# Patient Record
Sex: Female | Born: 1970 | Race: White | Hispanic: No | Marital: Married | Smoking: Never smoker
Health system: Southern US, Community
[De-identification: ages and names within clinical notes are randomized; demographics above are authoritative.]

## PROBLEM LIST (undated history)

## (undated) DIAGNOSIS — D649 Anemia, unspecified: Secondary | ICD-10-CM

## (undated) DIAGNOSIS — M069 Rheumatoid arthritis, unspecified: Secondary | ICD-10-CM

## (undated) DIAGNOSIS — N838 Other noninflammatory disorders of ovary, fallopian tube and broad ligament: Secondary | ICD-10-CM

## (undated) HISTORY — PX: TUBAL LIGATION: SHX77

## (undated) HISTORY — DX: Anemia, unspecified: D64.9

## (undated) HISTORY — DX: Other noninflammatory disorders of ovary, fallopian tube and broad ligament: N83.8

## (undated) HISTORY — DX: Rheumatoid arthritis, unspecified: M06.9

---

## 1997-04-29 DIAGNOSIS — N838 Other noninflammatory disorders of ovary, fallopian tube and broad ligament: Secondary | ICD-10-CM

## 1997-04-29 HISTORY — PX: OTHER SURGICAL HISTORY: SHX169

## 1997-04-29 HISTORY — DX: Other noninflammatory disorders of ovary, fallopian tube and broad ligament: N83.8

## 1998-04-29 HISTORY — PX: APPENDECTOMY: SHX54

## 1998-04-29 LAB — HM MAMMOGRAPHY: HM Mammogram: NORMAL

## 2003-11-02 ENCOUNTER — Emergency Department (HOSPITAL_COMMUNITY): Admission: EM | Admit: 2003-11-02 | Discharge: 2003-11-02 | Payer: Self-pay | Admitting: Family Medicine

## 2004-04-18 ENCOUNTER — Other Ambulatory Visit: Admission: RE | Admit: 2004-04-18 | Discharge: 2004-04-18 | Payer: Self-pay | Admitting: Obstetrics and Gynecology

## 2006-03-29 LAB — CONVERTED CEMR LAB: Pap Smear: NORMAL

## 2007-03-20 ENCOUNTER — Encounter: Payer: Self-pay | Admitting: Family Medicine

## 2007-04-15 ENCOUNTER — Ambulatory Visit: Payer: Self-pay | Admitting: Family Medicine

## 2007-05-04 ENCOUNTER — Encounter: Payer: Self-pay | Admitting: Family Medicine

## 2007-06-08 ENCOUNTER — Encounter: Payer: Self-pay | Admitting: Family Medicine

## 2007-06-12 DIAGNOSIS — M069 Rheumatoid arthritis, unspecified: Secondary | ICD-10-CM | POA: Insufficient documentation

## 2007-07-17 ENCOUNTER — Encounter: Payer: Self-pay | Admitting: Family Medicine

## 2007-07-17 ENCOUNTER — Telehealth: Payer: Self-pay | Admitting: Family Medicine

## 2007-07-20 ENCOUNTER — Ambulatory Visit: Payer: Self-pay | Admitting: Family Medicine

## 2007-09-11 ENCOUNTER — Encounter (INDEPENDENT_AMBULATORY_CARE_PROVIDER_SITE_OTHER): Payer: Self-pay | Admitting: *Deleted

## 2007-12-31 ENCOUNTER — Encounter: Payer: Self-pay | Admitting: Family Medicine

## 2008-01-08 ENCOUNTER — Encounter: Payer: Self-pay | Admitting: Family Medicine

## 2008-03-08 ENCOUNTER — Ambulatory Visit: Payer: Self-pay | Admitting: Family Medicine

## 2008-03-08 DIAGNOSIS — H109 Unspecified conjunctivitis: Secondary | ICD-10-CM | POA: Insufficient documentation

## 2008-04-06 ENCOUNTER — Encounter: Payer: Self-pay | Admitting: Family Medicine

## 2008-04-15 ENCOUNTER — Ambulatory Visit: Payer: Self-pay | Admitting: Family Medicine

## 2008-04-15 DIAGNOSIS — D649 Anemia, unspecified: Secondary | ICD-10-CM | POA: Insufficient documentation

## 2008-04-15 LAB — CONVERTED CEMR LAB
Ferritin: 4 ng/mL — ABNORMAL LOW (ref 10.0–291.0)
Iron: 34 ug/dL — ABNORMAL LOW (ref 42–145)
Saturation Ratios: 7.3 % — ABNORMAL LOW (ref 20.0–50.0)
Transferrin: 334.6 mg/dL (ref >=212.0)
Vitamin B-12: 285 pg/mL (ref 211–911)

## 2008-05-03 ENCOUNTER — Encounter: Payer: Self-pay | Admitting: Family Medicine

## 2008-08-02 ENCOUNTER — Encounter: Payer: Self-pay | Admitting: Family Medicine

## 2009-01-18 ENCOUNTER — Encounter: Payer: Self-pay | Admitting: Family Medicine

## 2009-06-01 ENCOUNTER — Encounter: Payer: Self-pay | Admitting: Family Medicine

## 2009-08-28 ENCOUNTER — Encounter: Payer: Self-pay | Admitting: Family Medicine

## 2009-08-31 ENCOUNTER — Encounter: Payer: Self-pay | Admitting: Family Medicine

## 2009-08-31 LAB — CONVERTED CEMR LAB
HCT: 32.7 %
Hemoglobin: 10.8 g/dL
MCHC: 33.2 g/dL
MCV: 76.1 fL
Platelets: 309 10*3/uL
RBC: 4.29 M/uL
RDW: 15.3 %
WBC: 4.5 10*3/uL

## 2009-09-07 ENCOUNTER — Encounter: Payer: Self-pay | Admitting: Family Medicine

## 2009-09-07 ENCOUNTER — Encounter (INDEPENDENT_AMBULATORY_CARE_PROVIDER_SITE_OTHER): Payer: Self-pay | Admitting: *Deleted

## 2009-09-07 LAB — CONVERTED CEMR LAB
Albumin: 3.9 g/dL
BUN: 12 mg/dL
CO2: 29 meq/L
Calcium: 8.9 mg/dL
Chloride: 101 meq/L
Creatinine, Ser: 0.7 mg/dL
Glucose, Bld: 87 mg/dL
Potassium: 4.1 meq/L
Sodium: 137 meq/L

## 2009-10-03 ENCOUNTER — Telehealth (INDEPENDENT_AMBULATORY_CARE_PROVIDER_SITE_OTHER): Payer: Self-pay | Admitting: *Deleted

## 2009-10-03 ENCOUNTER — Ambulatory Visit: Payer: Self-pay | Admitting: Family Medicine

## 2009-10-06 ENCOUNTER — Encounter (INDEPENDENT_AMBULATORY_CARE_PROVIDER_SITE_OTHER): Payer: Self-pay | Admitting: *Deleted

## 2009-10-06 LAB — CONVERTED CEMR LAB
ALT: 21 units/L (ref 0–35)
AST: 24 units/L (ref 0–37)
Albumin: 4.2 g/dL (ref 3.5–5.2)
Alkaline Phosphatase: 44 units/L (ref 39–117)
BUN: 12 mg/dL (ref 6–23)
Basophils Absolute: 0 10*3/uL (ref 0.0–0.1)
Basophils Relative: 0.4 % (ref 0.0–3.0)
Bilirubin, Direct: 0.1 mg/dL (ref 0.0–0.3)
CO2: 27 meq/L (ref 19–32)
Calcium: 9.4 mg/dL (ref 8.4–10.5)
Chloride: 106 meq/L (ref 96–112)
Cholesterol: 177 mg/dL (ref 0–200)
Creatinine, Ser: 0.6 mg/dL (ref 0.4–1.2)
Eosinophils Absolute: 0 10*3/uL (ref 0.0–0.7)
Eosinophils Relative: 1 % (ref 0.0–5.0)
Ferritin: 4.4 ng/mL — ABNORMAL LOW (ref 10.0–291.0)
GFR calc non Af Amer: 122.92 mL/min (ref >60)
Glucose, Bld: 87 mg/dL (ref 70–99)
HCT: 34 % — ABNORMAL LOW (ref 36.0–46.0)
HDL: 60.9 mg/dL (ref >39.00)
Hemoglobin: 11.5 g/dL — ABNORMAL LOW (ref 12.0–15.0)
Iron: 117 ug/dL (ref 42–145)
LDL Cholesterol: 97 mg/dL (ref 0–99)
Lymphocytes Relative: 20.6 % (ref 12.0–46.0)
Lymphs Abs: 0.9 10*3/uL (ref 0.7–4.0)
MCHC: 33.9 g/dL (ref 30.0–36.0)
MCV: 77.7 fL — ABNORMAL LOW (ref 78.0–100.0)
Monocytes Absolute: 0.4 10*3/uL (ref 0.1–1.0)
Monocytes Relative: 9.8 % (ref 3.0–12.0)
Neutro Abs: 3 10*3/uL (ref 1.4–7.7)
Neutrophils Relative %: 68.2 % (ref 43.0–77.0)
Platelets: 237 10*3/uL (ref 150.0–400.0)
Potassium: 3.8 meq/L (ref 3.5–5.1)
RBC: 4.38 M/uL (ref 3.87–5.11)
RDW: 18 % — ABNORMAL HIGH (ref 11.5–14.6)
Saturation Ratios: 25.7 % (ref 20.0–50.0)
Sodium: 141 meq/L (ref 135–145)
Total Bilirubin: 0.8 mg/dL (ref 0.3–1.2)
Total CHOL/HDL Ratio: 3
Total Protein: 6.9 g/dL (ref 6.0–8.3)
Transferrin: 325.6 mg/dL (ref 212.0–360.0)
Triglycerides: 95 mg/dL (ref 0.0–149.0)
VLDL: 19 mg/dL (ref 0.0–40.0)
WBC: 4.3 10*3/uL — ABNORMAL LOW (ref 4.5–10.5)

## 2009-11-16 ENCOUNTER — Encounter: Payer: Self-pay | Admitting: Family Medicine

## 2010-02-02 ENCOUNTER — Ambulatory Visit: Payer: Self-pay | Admitting: Family Medicine

## 2010-03-16 ENCOUNTER — Encounter: Payer: Self-pay | Admitting: Family Medicine

## 2010-05-30 NOTE — Letter (Signed)
Summary: Generic Letter   at [facility]  [address] [place], [place] [address]   Phone: [phone]  Fax: [phone]    09/07/2009     [facility] [place] [address] [place], [place]  [address]    Dear Ms. Heiney,  Dr. Ermalene Searing recieved you labs from [facility] and would like you to come in and have repeat labs for evaluation for iron deficiency. These labs include CBC, TOTAL IRON,TRANFERRIN,FERRITIN Dx. 285.9. Please call our office and schedule this appointment as soon as possible. Telephone number to office is [phone]          Sincerely,   Benny Lennert CMA (AAMA)

## 2010-05-30 NOTE — Letter (Signed)
Summary: [facility]   Imported By: Lanelle Bal 06/12/2009 08:03:31  _____________________________________________________________________  External Attachment:    Type:   Image     Comment:   External Document

## 2010-05-30 NOTE — Miscellaneous (Signed)
  Clinical Lists Changes  Medications: Added new medication of FERROUS SULFATE 325 (65 FE) MG TABS (FERROUS SULFATE) Take 1 tablet by mouth once a day

## 2010-05-30 NOTE — Letter (Signed)
Summary: [facility]   Imported By: Sherian Rein 04/03/2010 11:03:42  _____________________________________________________________________  External Attachment:    Type:   Image     Comment:   External Document

## 2010-05-30 NOTE — Miscellaneous (Signed)
  Clinical Lists Changes  Observations: Added new observation of CALCIUM: 8.9 mg/dL (52/84/1324 4:01) Added new observation of ALBUMIN: 3.9 g/dL (02/72/5366 4:40) Added new observation of CREATININE: 0.7 mg/dL (34/74/2595 6:38) Added new observation of BUN: 12 mg/dL (75/64/3329 5:18) Added new observation of BG RANDOM: 87 mg/dL (84/16/6063 0:16) Added new observation of CO2 PLSM/SER: 29 meq/L (09/07/2009 9:48) Added new observation of CL SERUM: 101 meq/L (09/07/2009 9:48) Added new observation of K SERUM: 4.1 meq/L (09/07/2009 9:48) Added new observation of NA: 137 meq/L (09/07/2009 9:48) Added new observation of PLATELETK/UL: 309 K/uL (08/31/2009 9:48) Added new observation of RDW: 15.3 % (08/31/2009 9:48) Added new observation of MCHC RBC: 33.2 g/dL (05/07/3233 5:73) Added new observation of MCV: 76.1 fL (08/31/2009 9:48) Added new observation of HCT: 32.7 % (08/31/2009 9:48) Added new observation of HGB: 10.8 g/dL (22/05/5425 0:62) Added new observation of RBC M/UL: 4.29 M/uL (08/31/2009 9:48) Added new observation of WBC COUNT: 4.5 10*3/microliter (08/31/2009 9:48)

## 2010-05-30 NOTE — Letter (Signed)
Summary: [facility]   Imported By: Lanelle Bal 11/28/2009 10:25:00  _____________________________________________________________________  External Attachment:    Type:   Image     Comment:   External Document

## 2010-05-30 NOTE — Progress Notes (Signed)
----   Converted from flag ---- ---- 10/03/2009 8:35 AM, Kerby Nora MD wrote: cbc diff, tot iron, ferritin, transferrin Dx 289.0.Marland KitchenMarland KitchenIf pt agreeable to make it fasting labs...she is also due for LIPIDs, CMET Dx v77.91  ---- 10/02/2009 4:16 PM, Mills Koller wrote: Patient coming in Tuesday for labs "pt says to check iron". Orders please, Thanks, Terri ------------------------------

## 2011-01-31 ENCOUNTER — Other Ambulatory Visit: Payer: Self-pay | Admitting: Internal Medicine

## 2011-01-31 DIAGNOSIS — M79669 Pain in unspecified lower leg: Secondary | ICD-10-CM

## 2011-02-01 ENCOUNTER — Ambulatory Visit
Admission: RE | Admit: 2011-02-01 | Discharge: 2011-02-01 | Disposition: A | Discharge status: Home / Self Care | Payer: BC Managed Care – PPO | Source: Ambulatory Visit | Attending: Internal Medicine | Admitting: Internal Medicine

## 2011-02-01 DIAGNOSIS — M79669 Pain in unspecified lower leg: Secondary | ICD-10-CM

## 2011-05-13 ENCOUNTER — Encounter: Payer: Self-pay | Admitting: Family Medicine

## 2011-05-13 ENCOUNTER — Ambulatory Visit (INDEPENDENT_AMBULATORY_CARE_PROVIDER_SITE_OTHER): Payer: BC Managed Care – PPO | Admitting: Family Medicine

## 2011-05-13 VITALS — BP 110/80 | HR 100 | Temp 98.7°F | Ht 70.0 in | Wt 160.5 lb

## 2011-05-13 DIAGNOSIS — J020 Streptococcal pharyngitis: Secondary | ICD-10-CM | POA: Insufficient documentation

## 2011-05-13 DIAGNOSIS — J329 Chronic sinusitis, unspecified: Secondary | ICD-10-CM

## 2011-05-13 MED ORDER — GUAIFENESIN-CODEINE 100-10 MG/5ML PO SYRP: 5.0000 mL | ORAL_SOLUTION | Freq: Every evening | ORAL | Status: AC | PRN

## 2011-05-13 NOTE — Patient Instructions (Signed)
Sounds like developing sinus infection. As early on likely still viral. Take medicine as prescribed: cheratussin for cough.  Use OTC afrin nasal spray at bedtime but no more than 2-3 nights in a row. Push fluids and plenty of rest. Nasal saline irrigation or neti pot to help drain sinuses. May use simple mucinex with plenty of fluid to help mobilize mucous. Let us know if fever >101.5, trouble opening/closing mouth, difficulty swallowing, or worsening - you may need to be seen again.

## 2011-05-13 NOTE — Progress Notes (Signed)
  Subjective:    Patient ID: Brittany Garner, female    DOB: Jan 24, 1971, 41 y.o.   MRN: [id number]  HPI CC: congestion  4d h/o significant sinus congestion.  Also with low grade fever since yesterday Tmax 99.8, fatigue.  + cough but not productive.  Mild ear ache bilaterally.  RN as well.  + PNDrainage.  Progressive sxs.  Has tried several OTC remedies.  No abd pain, n/v, HA, tooth ache, myalgias, arthralgias.  H/o RA on enbrel and MTX.  Has held enbrel while sick.  No sick contacts or smokers at home.  No h/o asthma,COPD.  Review of Systems Per HPI    Objective:   Physical Exam  Nursing note and vitals reviewed. Constitutional: She appears well-developed and well-nourished. No distress.  HENT:  Head: Normocephalic and atraumatic.  Right Ear: Hearing, tympanic membrane, external ear and ear canal normal.  Left Ear: Hearing, tympanic membrane, external ear and ear canal normal.  Nose: No mucosal edema or rhinorrhea. Right sinus exhibits no maxillary sinus tenderness and no frontal sinus tenderness. Left sinus exhibits no maxillary sinus tenderness and no frontal sinus tenderness.  Mouth/Throat: Uvula is midline, oropharynx is clear and moist and mucous membranes are normal. No oropharyngeal exudate, posterior oropharyngeal edema, posterior oropharyngeal erythema or tonsillar abscesses.  Eyes: Conjunctivae and EOM are normal. Pupils are equal, round, and reactive to light. No scleral icterus.  Neck: Normal range of motion. Neck supple. No JVD present. No thyromegaly present.  Cardiovascular: Normal rate, regular rhythm, normal heart sounds and intact distal pulses.   No murmur heard. Pulmonary/Chest: Effort normal and breath sounds normal. No respiratory distress. She has no wheezes. She has no rales.  Musculoskeletal: She exhibits no edema.  Lymphadenopathy:    She has no cervical adenopathy.  Skin: Skin is warm and dry. No rash noted.       Assessment & Plan:

## 2011-05-13 NOTE — Assessment & Plan Note (Signed)
Anticipate viral as early on. Agree with holding enbrel for now. Push fluids and symptomatic care as per instructions. If red flags notify us.

## 2011-06-06 ENCOUNTER — Encounter: Payer: Self-pay | Admitting: Family Medicine

## 2011-06-06 ENCOUNTER — Ambulatory Visit (INDEPENDENT_AMBULATORY_CARE_PROVIDER_SITE_OTHER): Payer: BC Managed Care – PPO | Admitting: Family Medicine

## 2011-06-06 VITALS — BP 112/78 | HR 104 | Temp 98.2°F | Wt 152.8 lb

## 2011-06-06 DIAGNOSIS — J020 Streptococcal pharyngitis: Secondary | ICD-10-CM

## 2011-06-06 DIAGNOSIS — J029 Acute pharyngitis, unspecified: Secondary | ICD-10-CM

## 2011-06-06 LAB — POCT RAPID STREP A (OFFICE): Rapid Strep A Screen: POSITIVE — AB

## 2011-06-06 MED ORDER — AZITHROMYCIN 250 MG PO TABS: ORAL_TABLET | ORAL | Status: AC

## 2011-06-06 MED ORDER — CEPHALEXIN 500 MG PO CAPS: 500.0000 mg | ORAL_CAPSULE | Freq: Two times a day (BID) | ORAL | Status: AC

## 2011-06-06 NOTE — Progress Notes (Signed)
  Subjective:    Patient ID: Brittany Garner, female    DOB: [birth year], 41 y.o.   MRN: [id number]  HPI CC: ST  Woke up 2 nights ago with ST.  Pain to swallow.  Fever all day yesterday to 102 Tmax.  Took tylenol this morning.  Swollen glands.  Possible mild drainage.  No abd pain, n/v, HA, cough.  No sick contacts at home.    No recent strep throat.  No recent abx use.    Review of Systems Per HPI    Objective:   Physical Exam  Nursing note and vitals reviewed. Constitutional: She appears well-developed and well-nourished. No distress.  HENT:  Head: Normocephalic and atraumatic.  Right Ear: Hearing, tympanic membrane, external ear and ear canal normal.  Left Ear: Hearing, tympanic membrane, external ear and ear canal normal.  Nose: No mucosal edema or rhinorrhea. Right sinus exhibits no maxillary sinus tenderness and no frontal sinus tenderness. Left sinus exhibits no maxillary sinus tenderness and no frontal sinus tenderness.  Mouth/Throat: Uvula is midline and mucous membranes are normal. Posterior oropharyngeal edema and posterior oropharyngeal erythema present. No oropharyngeal exudate or tonsillar abscesses.       No exudates  Eyes: Conjunctivae and EOM are normal. Pupils are equal, round, and reactive to light. No scleral icterus.  Neck: Normal range of motion. Neck supple.  Cardiovascular: Normal rate, regular rhythm, normal heart sounds and intact distal pulses.   No murmur heard. Pulmonary/Chest: Effort normal and breath sounds normal. No respiratory distress. She has no wheezes. She has no rales.  Lymphadenopathy:    She has cervical adenopathy (R AC LAD).  Skin: Skin is warm and dry. No rash noted.       Assessment & Plan:

## 2011-06-06 NOTE — Patient Instructions (Addendum)
You have strep pharyngitis. Take zpack.  Hold enbrel while on this. Push fluids and plenty of rest. May use ibuprofen for throat inflammation. Salt water gargles. Suck on cold things like popsicles or warm things like herbal teas (whichever soothes the throat better). Return if fever >101.5, worsening pain, or trouble opening/closing mouth, or hoarse voice. Good to see you today, call clinic with questions.

## 2011-06-06 NOTE — Assessment & Plan Note (Signed)
3/4 centor criteria positive. RST positive  PCN allergy, treat with zpack. Update [place] if sxs not improving. See pt instructions for symptomatic relief.

## 2012-08-21 ENCOUNTER — Ambulatory Visit (INDEPENDENT_AMBULATORY_CARE_PROVIDER_SITE_OTHER): Payer: BC Managed Care – PPO | Admitting: Family Medicine

## 2012-08-21 ENCOUNTER — Encounter: Payer: Self-pay | Admitting: Family Medicine

## 2012-08-21 VITALS — BP 102/60 | HR 72 | Temp 98.2°F | Wt 164.0 lb

## 2012-08-21 DIAGNOSIS — B309 Viral conjunctivitis, unspecified: Secondary | ICD-10-CM

## 2012-08-21 DIAGNOSIS — J069 Acute upper respiratory infection, unspecified: Secondary | ICD-10-CM | POA: Insufficient documentation

## 2012-08-21 NOTE — Assessment & Plan Note (Signed)
Symptomatic care 

## 2012-08-21 NOTE — Patient Instructions (Addendum)
Viral conjunctivitis and viral upper respiratory infeciton.  Mucinex for congestion.  Saline drops to sooth eye.  Call if eye not improving in 2-3 day.

## 2012-08-21 NOTE — Progress Notes (Signed)
  Subjective:    Patient ID: Brittany Garner, female    DOB: March 17, 1971, 42 y.o.   MRN: [id number]  HPI 42 year old female presents with 1 day of sore throat, right eye redness and swelling.  Right eye uncomfortable but not painful, not itchy.. Some discharge yellow yesterday, clear discharge.  nasal congestion.  Wears contacts. No foreign body sensation. No face or ear pain. No fever. No SOB or cough.   Using ibuprofen prn.  No history of allergies. Some sick contacts lately.   Review of Systems  Constitutional: Negative for fever and fatigue.  HENT: Negative for ear pain.   Eyes: Negative for pain.  Respiratory: Negative for chest tightness and shortness of breath.   Cardiovascular: Negative for chest pain, palpitations and leg swelling.  Gastrointestinal: Negative for abdominal pain.  Genitourinary: Negative for dysuria.       Objective:   Physical Exam  Constitutional: Vital signs are normal. She appears well-developed and well-nourished. She is cooperative.  Non-toxic appearance. She does not appear ill. No distress.  HENT:  Head: Normocephalic.  Right Ear: Hearing, tympanic membrane, external ear and ear canal normal. Tympanic membrane is not erythematous, not retracted and not bulging.  Left Ear: Hearing, tympanic membrane, external ear and ear canal normal. Tympanic membrane is not erythematous, not retracted and not bulging.  Nose: Mucosal edema and rhinorrhea present. Right sinus exhibits no maxillary sinus tenderness and no frontal sinus tenderness. Left sinus exhibits no maxillary sinus tenderness and no frontal sinus tenderness.  Mouth/Throat: Uvula is midline, oropharynx is clear and moist and mucous membranes are normal.  Eyes: EOM and lids are normal. Pupils are equal, round, and reactive to light. No foreign bodies found. Right eye exhibits no discharge and no exudate. Right conjunctiva is injected. Right conjunctiva has no hemorrhage. Right eye exhibits normal  extraocular motion and no nystagmus. Right pupil is round and reactive. Pupils are equal.  Neck: Trachea normal and normal range of motion. Neck supple. Carotid bruit is not present. No mass and no thyromegaly present.  Cardiovascular: Normal rate, regular rhythm, S1 normal, S2 normal, normal heart sounds, intact distal pulses and normal pulses.  Exam reveals no gallop and no friction rub.   No murmur heard. Pulmonary/Chest: Effort normal and breath sounds normal. Not tachypneic. No respiratory distress. She has no decreased breath sounds. She has no wheezes. She has no rhonchi. She has no rales.  Neurological: She is alert.  Skin: Skin is warm, dry and intact. No rash noted.  Psychiatric: Her speech is normal and behavior is normal. Judgment normal. Her mood appears not anxious. Cognition and memory are normal. She does not exhibit a depressed mood.          Assessment & Plan:

## 2012-08-27 ENCOUNTER — Emergency Department (HOSPITAL_COMMUNITY)
Admission: EM | Admit: 2012-08-27 | Discharge: 2012-08-28 | Disposition: A | Discharge status: Home / Self Care | Payer: BC Managed Care – PPO | Attending: Emergency Medicine | Admitting: Emergency Medicine

## 2012-08-27 ENCOUNTER — Encounter (HOSPITAL_COMMUNITY): Payer: Self-pay | Admitting: Adult Health

## 2012-08-27 DIAGNOSIS — Z880 Allergy status to penicillin: Secondary | ICD-10-CM | POA: Insufficient documentation

## 2012-08-27 DIAGNOSIS — R5383 Other fatigue: Secondary | ICD-10-CM | POA: Insufficient documentation

## 2012-08-27 DIAGNOSIS — J069 Acute upper respiratory infection, unspecified: Secondary | ICD-10-CM

## 2012-08-27 DIAGNOSIS — R509 Fever, unspecified: Secondary | ICD-10-CM | POA: Insufficient documentation

## 2012-08-27 DIAGNOSIS — Z862 Personal history of diseases of the blood and blood-forming organs and certain disorders involving the immune mechanism: Secondary | ICD-10-CM | POA: Insufficient documentation

## 2012-08-27 DIAGNOSIS — R5381 Other malaise: Secondary | ICD-10-CM | POA: Insufficient documentation

## 2012-08-27 DIAGNOSIS — Z79899 Other long term (current) drug therapy: Secondary | ICD-10-CM | POA: Insufficient documentation

## 2012-08-27 DIAGNOSIS — J029 Acute pharyngitis, unspecified: Secondary | ICD-10-CM

## 2012-08-27 DIAGNOSIS — H9209 Otalgia, unspecified ear: Secondary | ICD-10-CM | POA: Insufficient documentation

## 2012-08-27 DIAGNOSIS — Z8739 Personal history of other diseases of the musculoskeletal system and connective tissue: Secondary | ICD-10-CM | POA: Insufficient documentation

## 2012-08-27 LAB — RAPID STREP SCREEN (MED CTR MEBANE ONLY): Streptococcus, Group A Screen (Direct): NEGATIVE

## 2012-08-27 NOTE — ED Notes (Signed)
Reports sore throat that began last weds, has gotten progressively worse, painis worse with speaking and swallowing. Airway intact, handling secretions, pain is bilateral, throat red.

## 2012-08-28 ENCOUNTER — Ambulatory Visit: Payer: BC Managed Care – PPO | Admitting: Family Medicine

## 2012-08-28 MED ORDER — OXYMETAZOLINE HCL 0.05 % NA SOLN: 2.0000 | Freq: Two times a day (BID) | NASAL | Status: DC

## 2012-08-28 MED ORDER — DIPHENHYDRAMINE HCL 12.5 MG/5ML PO ELIX
12.5000 mg | ORAL_SOLUTION | Freq: Once | ORAL | Status: AC
  Administered 2012-08-28: 12.5 mg via ORAL
  Filled 2012-08-28: qty 10

## 2012-08-28 MED ORDER — ALUM & MAG HYDROXIDE-SIMETH 200-200-20 MG/5ML PO SUSP
5.0000 mL | Freq: Once | ORAL | Status: AC
  Administered 2012-08-28: 5 mL via ORAL
  Filled 2012-08-28: qty 30

## 2012-08-28 NOTE — ED Notes (Signed)
Pt discharged.Vital signs stable and GCS 15 

## 2012-08-28 NOTE — ED Provider Notes (Signed)
History     CSN: [id number]  Arrival date & time 08/27/12  2130   None     Chief Complaint  Patient presents with  . Sore Throat    (Consider location/radiation/quality/duration/timing/severity/associated sxs/prior treatment) Patient is a 42 y.o. female presenting with pharyngitis. The history is provided by the patient. No language interpreter was used.  Sore Throat This is a new problem. The current episode started in the past 7 days. The problem occurs constantly. The problem has been gradually worsening. Associated symptoms include congestion, fatigue, a sore throat and swollen glands. Pertinent negatives include no chest pain, coughing, nausea, neck pain, numbness, rash or vomiting. Associated symptoms comments: +subjective fever. The symptoms are aggravated by swallowing. Treatments tried: salt water gargle; ibuprofen. The treatment provided mild relief.    Past Medical History  Diagnosis Date  . Rheumatoid arthritis   . Anemia, unspecified   . Rupture, fallopian tube 1999    Past Surgical History  Procedure Laterality Date  . Appendectomy  2000  . Tubal ligation      bilateral  . Fallopian tube repair  1999    laproscopic    Family History  Problem Relation Age of Onset  . Emphysema Father   . Fibromyalgia Mother   . Healthy Sister     half-sister  . Rheum arthritis Maternal Grandmother   . Heart failure Maternal Grandmother     CHF  . Kidney failure Maternal Grandfather     dialysis; unknown cause for failure  . Heart attack Paternal Grandfather   . Other Paternal Grandmother     pacemaker    History  Substance Use Topics  . Smoking status: Never Smoker   . Smokeless tobacco: Not on file  . Alcohol Use: No    OB History   Grav Para Term Preterm Abortions TAB SAB Ect Mult Living                  Review of Systems  Constitutional: Positive for fatigue.  HENT: Positive for ear pain, congestion and sore throat. Negative for hearing loss,  rhinorrhea, drooling, trouble swallowing, neck pain, tinnitus and ear discharge.   Respiratory: Negative for cough.   Cardiovascular: Negative for chest pain.  Gastrointestinal: Negative for nausea and vomiting.  Skin: Negative for rash.  Neurological: Negative for numbness.  All other systems reviewed and are negative.    Allergies  Penicillins  Home Medications   Current Outpatient Rx  Name  Route  Sig  Dispense  Refill  . etanercept (ENBREL) 50 MG/ML injection   Subcutaneous   Inject 50 mg into the skin once a week. sundays         . ferrous sulfate 325 (65 FE) MG tablet   Oral   Take 325 mg by mouth daily with breakfast.         . FOLIC ACID PO   Oral   Take 1 tablet by mouth daily.          . methotrexate 2.5 MG tablet   Oral   Take 15 mg by mouth once a week. Sundays 6 tabs         . oxymetazoline (AFRIN NASAL SPRAY) 0.05 % nasal spray   Nasal   Place 2 sprays into the nose 2 (two) times daily. Use for NO MORE THAN 3 days   30 mL   0     BP 128/72  Pulse 103  Temp(Src) 98.1 F (36.7 C) (Oral)  SpO2 99%  Physical Exam  Nursing note and vitals reviewed. Constitutional: She is oriented to person, place, and time. She appears well-developed and well-nourished. No distress.  HENT:  Head: Normocephalic and atraumatic. No trismus in the jaw.  Right Ear: External ear and ear canal normal. A middle ear effusion is present.  Left Ear: External ear and ear canal normal. A middle ear effusion is present.  Nose: Nose normal.  Mouth/Throat: Uvula is midline and mucous membranes are normal. No oral lesions. Normal dentition. No dental abscesses, edematous or dental caries. Posterior oropharyngeal erythema present. No oropharyngeal exudate, posterior oropharyngeal edema or tonsillar abscesses.  +b/l fluid in middle ear; clear and not purulent  Eyes: Conjunctivae and EOM are normal. Pupils are equal, round, and reactive to light. No scleral icterus.  Neck:  Normal range of motion. Neck supple.  Cardiovascular: Normal rate, regular rhythm, normal heart sounds and intact distal pulses.   Pulmonary/Chest: Effort normal and breath sounds normal. No respiratory distress. She has no wheezes. She has no rales.  Musculoskeletal: Normal range of motion.  Lymphadenopathy:    She has cervical adenopathy.  Neurological: She is alert and oriented to person, place, and time.  Skin: Skin is warm and dry. No rash noted. She is not diaphoretic. No erythema.  Psychiatric: She has a normal mood and affect. Her behavior is normal.    ED Course  Procedures (including critical care time)  Labs Reviewed  RAPID STREP SCREEN   No results found.   1. Viral URI   2. Pharyngitis     MDM  Uncomplicated viral URI with pharyngitis. Rapid strep screen negative. On physical exam patient is well and nontoxic appearing, in no acute distress. Bilateral ear effusions with clear fluid appreciated as well as posterior oral pharyngeal erythema without edema or exudate. Patient is not febrile, tachycardic, tachypneic or hypoxic to suspect early PNA. Uvula midline without edema and tonsils nonenlarged to suspect PTA. Patient given Benadryl and Maalox solution to gargle and spit for symptoms. Patient admits to scheduled follow up with her primary care provider tomorrow at 9:15AM. Ibuprofen and Chloraseptic spray recommended for symptoms; Afrin prescribed for nasal congestion. Indications for ED return discussed. Patient states comfort and understanding with this discharge plan with no unaddressed concerns.        Antony Madura, PA-C 09/01/12 [phone]

## 2012-09-01 ENCOUNTER — Encounter: Payer: Self-pay | Admitting: Family Medicine

## 2012-09-01 ENCOUNTER — Ambulatory Visit (INDEPENDENT_AMBULATORY_CARE_PROVIDER_SITE_OTHER): Payer: BC Managed Care – PPO | Admitting: Family Medicine

## 2012-09-01 VITALS — BP 120/70 | HR 98 | Temp 98.3°F | Ht 70.0 in | Wt 157.5 lb

## 2012-09-01 DIAGNOSIS — B309 Viral conjunctivitis, unspecified: Secondary | ICD-10-CM

## 2012-09-01 MED ORDER — NEOMYCIN-POLYMYXIN-HC 3.5-10000-1 OP SUSP: OPHTHALMIC | Status: DC

## 2012-09-01 NOTE — Patient Instructions (Addendum)
Apply lubricating drops as needed. If not improving in 4-5 days or copius yellow discharge, fever... Start antibitoics drops.

## 2012-09-01 NOTE — ED Provider Notes (Signed)
Medical screening examination/treatment/procedure(s) were performed by non-physician practitioner and as supervising physician I was immediately available for consultation/collaboration.   Benny Lennert, MD 09/01/12 [phone]

## 2012-09-01 NOTE — Progress Notes (Signed)
  Subjective:    Patient ID: Brittany Garner, female    DOB: Apr 05, 1971, 42 y.o.   MRN: [id number]  HPI 42 year old female presents for follow up .Marland Kitchen Seen on 4/25 Dx with viral syndrome and viral conjunctivitis, treated symptomatically.  Today she reports  She had 100 percent improvement in eye, only a little remaining sore throat and congestion.  In last 24 hours she has noted recurrence of redness in right eye, itching, clear discharge.  No eye pain, no vision change, puffy around eye.  No fever. Using allergy eye drops.  No sneeze. No face or ear pain.  No other sick contacts.  Review of Systems  Constitutional: Negative for fever and fatigue.  HENT: Negative for ear pain.   Eyes: Negative for pain.  Respiratory: Negative for chest tightness and shortness of breath.   Cardiovascular: Negative for chest pain, palpitations and leg swelling.  Gastrointestinal: Negative for abdominal pain.  Genitourinary: Negative for dysuria.       Objective:   Physical Exam  Constitutional: Vital signs are normal. She appears well-developed and well-nourished. She is cooperative.  Non-toxic appearance. She does not appear ill. No distress.  HENT:  Head: Normocephalic.  Right Ear: Hearing, tympanic membrane, external ear and ear canal normal. Tympanic membrane is not erythematous, not retracted and not bulging.  Left Ear: Hearing, tympanic membrane, external ear and ear canal normal. Tympanic membrane is not erythematous, not retracted and not bulging.  Nose: Mucosal edema and rhinorrhea present. Right sinus exhibits no maxillary sinus tenderness and no frontal sinus tenderness. Left sinus exhibits no maxillary sinus tenderness and no frontal sinus tenderness.  Mouth/Throat: Uvula is midline, oropharynx is clear and moist and mucous membranes are normal.  Eyes: EOM and lids are normal. Pupils are equal, round, and reactive to light. No foreign bodies found. Right eye exhibits no discharge and no  exudate. Right conjunctiva is injected. Right conjunctiva has no hemorrhage. Right eye exhibits normal extraocular motion and no nystagmus. Right pupil is round and reactive. Pupils are equal.  Neck: Trachea normal and normal range of motion. Neck supple. Carotid bruit is not present. No mass and no thyromegaly present.  Cardiovascular: Normal rate, regular rhythm, S1 normal, S2 normal, normal heart sounds, intact distal pulses and normal pulses.  Exam reveals no gallop and no friction rub.   No murmur heard. Pulmonary/Chest: Effort normal and breath sounds normal. Not tachypneic. No respiratory distress. She has no decreased breath sounds. She has no wheezes. She has no rhonchi. She has no rales.  Neurological: She is alert.  Skin: Skin is warm, dry and intact. No rash noted.  Psychiatric: Her speech is normal and behavior is normal. Judgment normal. Her mood appears not anxious. Cognition and memory are normal. She does not exhibit a depressed mood.          Assessment & Plan:

## 2014-12-13 ENCOUNTER — Ambulatory Visit (INDEPENDENT_AMBULATORY_CARE_PROVIDER_SITE_OTHER): Payer: 59 | Admitting: Family Medicine

## 2014-12-13 ENCOUNTER — Encounter: Payer: Self-pay | Admitting: Family Medicine

## 2014-12-13 VITALS — BP 90/58 | HR 68 | Temp 98.6°F | Ht 68.0 in | Wt 159.0 lb

## 2014-12-13 DIAGNOSIS — M069 Rheumatoid arthritis, unspecified: Secondary | ICD-10-CM | POA: Diagnosis not present

## 2014-12-13 NOTE — Progress Notes (Signed)
   Subjective:    Patient ID: Brittany Garner, female    DOB: 16-Mar-1971, 44 y.o.   MRN: [id number]  HPI  44 year old female  with rheumatoid arthritis presents for referral to rheumatologist.  She has not been seen since 2014 here.  Her current rheumatologist, Dr. Dierdre Forth is leaving the practice. She will be seen there but need referral sent. Dr. Olin Pia.  On enbrel and methotrexate. Joint pain is well controlled. Exercise:2-3 times a week treadmill.   Last CPX at [facility]. PAP smear in last few years. Mammo at GYN. She is not  sure if cholesterol checked at GYN. No  Early colon cancer, breast cancer.  VAccines uptodate 2008, gets yearly flu at rheum.      Review of Systems  Constitutional: Negative for fever and fatigue.  HENT: Negative for ear pain.   Eyes: Negative for pain.  Respiratory: Negative for chest tightness and shortness of breath.   Cardiovascular: Negative for chest pain, palpitations and leg swelling.  Gastrointestinal: Negative for abdominal pain.  Genitourinary: Negative for dysuria.       Objective:   Physical Exam  Constitutional: Vital signs are normal. She appears well-developed and well-nourished. She is cooperative.  Non-toxic appearance. She does not appear ill. No distress.  HENT:  Head: Normocephalic.  Right Ear: Hearing, tympanic membrane, external ear and ear canal normal. Tympanic membrane is not erythematous, not retracted and not bulging.  Left Ear: Hearing, tympanic membrane, external ear and ear canal normal. Tympanic membrane is not erythematous, not retracted and not bulging.  Nose: No mucosal edema or rhinorrhea. Right sinus exhibits no maxillary sinus tenderness and no frontal sinus tenderness. Left sinus exhibits no maxillary sinus tenderness and no frontal sinus tenderness.  Mouth/Throat: Uvula is midline, oropharynx is clear and moist and mucous membranes are normal.  Eyes: Conjunctivae, EOM and lids are normal. Pupils  are equal, round, and reactive to light. Lids are everted and swept, no foreign bodies found.  Neck: Trachea normal and normal range of motion. Neck supple. Carotid bruit is not present. No thyroid mass and no thyromegaly present.  Cardiovascular: Normal rate, regular rhythm, S1 normal, S2 normal, normal heart sounds, intact distal pulses and normal pulses.  Exam reveals no gallop and no friction rub.   No murmur heard. Pulmonary/Chest: Effort normal and breath sounds normal. No tachypnea. No respiratory distress. She has no decreased breath sounds. She has no wheezes. She has no rhonchi. She has no rales.  Abdominal: Soft. Normal appearance and bowel sounds are normal. There is no tenderness.  Musculoskeletal:  No erythema, swelling in bilateral MCP joints.  Neurological: She is alert.  Skin: Skin is warm, dry and intact. No rash noted.  Psychiatric: Her speech is normal and behavior is normal. Judgment and thought content normal. Her mood appears not anxious. Cognition and memory are normal. She does not exhibit a depressed mood.          Assessment & Plan:

## 2014-12-13 NOTE — Patient Instructions (Signed)
Stop at front desk to set up rheum referral. Call if interested in  Chol screen.

## 2014-12-13 NOTE — Assessment & Plan Note (Addendum)
Well controlled on enbrel and methotrexate.  Refer to rheum for continued treatment and labs

## 2014-12-13 NOTE — Progress Notes (Signed)
Pre visit review using our clinic review tool, if applicable. No additional management support is needed unless otherwise documented below in the visit note. 

## 2015-12-08 ENCOUNTER — Telehealth: Payer: Self-pay | Admitting: Family Medicine

## 2015-12-08 DIAGNOSIS — Z13220 Encounter for screening for lipoid disorders: Secondary | ICD-10-CM

## 2015-12-08 DIAGNOSIS — D649 Anemia, unspecified: Secondary | ICD-10-CM

## 2015-12-08 NOTE — Telephone Encounter (Signed)
-----   Message from Baldomero Lamy sent at 12/06/2015  2:57 PM EDT ----- Regarding: Cpx labs Tues 8/15, need orders. Thanks! :-) Please order  future cpx labs for pt's upcoming lab appt. Thanks Rodney Booze

## 2015-12-12 ENCOUNTER — Other Ambulatory Visit: Payer: 59

## 2015-12-19 ENCOUNTER — Telehealth: Payer: Self-pay | Admitting: Family Medicine

## 2015-12-19 ENCOUNTER — Encounter: Payer: 59 | Admitting: Family Medicine

## 2015-12-19 DIAGNOSIS — Z0289 Encounter for other administrative examinations: Secondary | ICD-10-CM

## 2015-12-19 NOTE — Telephone Encounter (Signed)
Yes reschedule

## 2015-12-19 NOTE — Telephone Encounter (Signed)
Patient did not come in for their appointment today for cpe.  Please let me know if patient needs to be contacted immediately for follow up or no follow up needed. °

## 2015-12-22 NOTE — Telephone Encounter (Signed)
Left message for patient to return call.

## 2015-12-27 ENCOUNTER — Encounter: Payer: Self-pay | Admitting: Family Medicine

## 2015-12-27 NOTE — Telephone Encounter (Signed)
Sent letter to pt to rs appt  °

## 2017-05-30 ENCOUNTER — Encounter: Payer: Self-pay | Admitting: Family Medicine

## 2017-05-30 ENCOUNTER — Other Ambulatory Visit: Payer: Self-pay

## 2017-05-30 ENCOUNTER — Ambulatory Visit (INDEPENDENT_AMBULATORY_CARE_PROVIDER_SITE_OTHER): Payer: BLUE CROSS/BLUE SHIELD | Admitting: Family Medicine

## 2017-05-30 VITALS — BP 90/60 | HR 70 | Temp 98.8°F | Ht 68.5 in | Wt 154.8 lb

## 2017-05-30 DIAGNOSIS — B070 Plantar wart: Secondary | ICD-10-CM

## 2017-05-30 DIAGNOSIS — B078 Other viral warts: Secondary | ICD-10-CM | POA: Insufficient documentation

## 2017-05-30 DIAGNOSIS — Z23 Encounter for immunization: Secondary | ICD-10-CM

## 2017-05-30 DIAGNOSIS — B079 Viral wart, unspecified: Secondary | ICD-10-CM | POA: Insufficient documentation

## 2017-05-30 NOTE — Patient Instructions (Signed)
Keep area clean and dry. Cover with band aid.  Return if lesion does not resolve or returns.

## 2017-05-30 NOTE — Addendum Note (Signed)
Addended by: Kerby Nora E on: 05/30/2017 09:34 AM   Modules accepted: Level of Service

## 2017-05-30 NOTE — Progress Notes (Signed)
   Subjective:    Patient ID: Brittany Garner, female    DOB: 02-May-1970, 47 y.o.   MRN: [id number]  HPI   47 year old female has note knot on based on 2nd toe.  Has increased in size.  Sore over the area with pressure when walking. No discharge, no fever.  Dr. Lestine Box treated for toe separation f 2 and 3rd.. Pins and screws placed to straighten toes.   Hx of RA in remission on enbrel on methotrexate.   Review of Systems  Constitutional: Negative for fatigue and fever.  HENT: Negative for ear pain.   Eyes: Negative for pain.  Respiratory: Negative for chest tightness and shortness of breath.   Cardiovascular: Negative for chest pain, palpitations and leg swelling.  Gastrointestinal: Negative for abdominal pain.  Genitourinary: Negative for dysuria.       Objective:   Physical Exam  Constitutional: Vital signs are normal. She appears well-developed and well-nourished. She is cooperative.  Non-toxic appearance. She does not appear ill. No distress.  HENT:  Head: Normocephalic.  Right Ear: Hearing, tympanic membrane, external ear and ear canal normal. Tympanic membrane is not erythematous, not retracted and not bulging.  Left Ear: Hearing, tympanic membrane, external ear and ear canal normal. Tympanic membrane is not erythematous, not retracted and not bulging.  Nose: No mucosal edema or rhinorrhea. Right sinus exhibits no maxillary sinus tenderness and no frontal sinus tenderness. Left sinus exhibits no maxillary sinus tenderness and no frontal sinus tenderness.  Mouth/Throat: Uvula is midline, oropharynx is clear and moist and mucous membranes are normal.  Eyes: Conjunctivae, EOM and lids are normal. Pupils are equal, round, and reactive to light. Lids are everted and swept, no foreign bodies found.  Neck: Trachea normal and normal range of motion. Neck supple. Carotid bruit is not present. No thyroid mass and no thyromegaly present.  Cardiovascular: Normal rate, regular rhythm, S1  normal, S2 normal, normal heart sounds, intact distal pulses and normal pulses. Exam reveals no gallop and no friction rub.  No murmur heard. Pulmonary/Chest: Effort normal and breath sounds normal. No tachypnea. No respiratory distress. She has no decreased breath sounds. She has no wheezes. She has no rhonchi. She has no rales.  Abdominal: Soft. Normal appearance and bowel sounds are normal. There is no tenderness.  Neurological: She is alert.  Skin: Skin is warm, dry and intact. No rash noted.  Raise lesion on 3rd toe, plantar surface.  Psychiatric: Her speech is normal and behavior is normal. Judgment and thought content normal. Her mood appears not anxious. Cognition and memory are normal. She does not exhibit a depressed mood.          Assessment & Plan:

## 2017-05-30 NOTE — Assessment & Plan Note (Signed)
Procedure: cryothearpy. Treated with 3 freeze thaw cycles with 3 mm halo. No complicaitons.

## 2017-05-30 NOTE — Addendum Note (Signed)
Addended by: Damita Lack on: 05/30/2017 10:23 AM   Modules accepted: Orders

## 2017-06-25 DIAGNOSIS — M0589 Other rheumatoid arthritis with rheumatoid factor of multiple sites: Secondary | ICD-10-CM | POA: Diagnosis not present

## 2017-09-24 DIAGNOSIS — M0589 Other rheumatoid arthritis with rheumatoid factor of multiple sites: Secondary | ICD-10-CM | POA: Diagnosis not present

## 2017-12-23 DIAGNOSIS — M0589 Other rheumatoid arthritis with rheumatoid factor of multiple sites: Secondary | ICD-10-CM | POA: Diagnosis not present

## 2018-03-31 DIAGNOSIS — M0589 Other rheumatoid arthritis with rheumatoid factor of multiple sites: Secondary | ICD-10-CM | POA: Diagnosis not present

## 2019-01-28 DIAGNOSIS — M0589 Other rheumatoid arthritis with rheumatoid factor of multiple sites: Secondary | ICD-10-CM | POA: Diagnosis not present

## 2019-03-11 ENCOUNTER — Other Ambulatory Visit: Payer: Self-pay | Admitting: Family Medicine

## 2019-03-11 ENCOUNTER — Encounter: Payer: Self-pay | Admitting: Family Medicine

## 2019-03-11 ENCOUNTER — Other Ambulatory Visit: Payer: Self-pay

## 2019-03-11 ENCOUNTER — Ambulatory Visit (INDEPENDENT_AMBULATORY_CARE_PROVIDER_SITE_OTHER): Payer: 59 | Admitting: Family Medicine

## 2019-03-11 ENCOUNTER — Telehealth: Payer: Self-pay | Admitting: Family Medicine

## 2019-03-11 DIAGNOSIS — L239 Allergic contact dermatitis, unspecified cause: Secondary | ICD-10-CM | POA: Diagnosis not present

## 2019-03-11 DIAGNOSIS — T7840XA Allergy, unspecified, initial encounter: Secondary | ICD-10-CM | POA: Diagnosis not present

## 2019-03-11 MED ORDER — AZELASTINE HCL 0.05 % OP SOLN: OPHTHALMIC | 0 refills | Status: DC

## 2019-03-11 MED ORDER — PREDNISONE 20 MG PO TABS: ORAL_TABLET | ORAL | 0 refills | Status: DC

## 2019-03-11 MED ORDER — OLOPATADINE HCL 0.1 % OP SOLN: 1.0000 [drp] | Freq: Two times a day (BID) | OPHTHALMIC | 0 refills | Status: DC

## 2019-03-11 NOTE — Telephone Encounter (Signed)
Patanol is not covered.  Pharmacy is asking for alternative Rx-Optivar.

## 2019-03-11 NOTE — Progress Notes (Signed)
VIRTUAL VISIT Due to national recommendations of social distancing due to [place] 19, a virtual visit is felt to be most appropriate for this patient at this time.   I connected with the patient on 03/11/19 at  9:00 AM EST by virtual telehealth platform and verified that I am speaking with the correct person using two identifiers.   I discussed the limitations, risks, security and privacy concerns of performing an evaluation and management service by  virtual telehealth platform and the availability of in person appointments. I also discussed with the patient that there may be a patient responsible charge related to this service. The patient expressed understanding and agreed to proceed.  Patient location: Home Provider Location: [facility] [place] Participants: Eliezer Lofts and Olena Mater Warda   Chief Complaint  Patient presents with  . Allergic Reaction    Eye, Face & Neck Itchy- Hair dye vs. shrimp    History of Present Illness: 48 year old female presents for  New onset rash/ allergic reaction on  Face and neck.  Started with scalp itching and redness in scalp... almost like chemical burn... 10/26.   On 11/ 5 ate shrimp ( not past allergy)  Noted eye swelling and nasal congestion few hours after, resolved after a few ore hours. Mild post nasal congestion now.  No lips swelling, no dysphagia, no SOB, no wheeze.   Since then she noted red bumps on face. Very itchy face  New exposures:   ? Due to  New boxed hair dye of shrimp   Has allergy testing set up later in the month.   Has tried Xyzal, zyrtec, claritin.. no relief. Has applied neosporin.  COVID 19 screen No recent travel or known exposure to COVID19 The patient denies respiratory symptoms of COVID 19 at this time.  The importance of social distancing was discussed today.   Review of Systems  Constitutional: Negative for chills and fever.  HENT: Negative for ear pain.   Eyes: Negative for pain and redness.   Respiratory: Negative for cough and shortness of breath.   Cardiovascular: Negative for chest pain, palpitations and leg swelling.  Gastrointestinal: Negative for abdominal pain, blood in stool, constipation, diarrhea, nausea and vomiting.  Genitourinary: Negative for dysuria.  Musculoskeletal: Negative for falls and myalgias.  Skin: Positive for rash.  Neurological: Negative for dizziness.  Psychiatric/Behavioral: Negative for depression. The patient is not nervous/anxious.       Past Medical History:  Diagnosis Date  . Anemia, unspecified   . Rheumatoid arthritis(714.0)   . Rupture, fallopian tube 1999    reports that she has never smoked. She has never used smokeless tobacco. She reports that she does not drink alcohol or use drugs.   Current Outpatient Medications:  .  etanercept (ENBREL) 50 MG/ML injection, Inject 50 mg into the skin once a week. sundays, Disp: , Rfl:  .  methotrexate 2.5 MG tablet, Take 15 mg by mouth once a week. Sundays 6 tabs, Disp: , Rfl:    Observations/Objective: Last menstrual period 02/14/2019.  Physical Exam  Physical Exam Constitutional:      General: The patient is not in acute distress. Pulmonary:     Effort: Pulmonary effort is normal. No respiratory distress.  Speaking in complete sentences. Neurological:     Mental Status: The patient is alert and oriented to person, place, and time.  Psychiatric:        Mood and Affect: Mood normal.        Behavior: Behavior normal.  Skin: erythematous nodules on forehead and cheeks, irriitated edges of eye.. conjunctiva normal.  Lips no swollen  Assessment and Plan Allergic reaction No acute respiratory distress.. no involvement of oral mucus membrane or breathing.  Trea with oral prednisone taper.  Allergy testing.  Continue orl antihistmaine of choice and add topical ophthalmic antihistamine (patanol)     I discussed the assessment and treatment plan with the patient. The patient was provided  an opportunity to ask questions and all were answered. The patient agreed with the plan and demonstrated an understanding of the instructions.   The patient was advised to call back or seek an in-person evaluation if the symptoms worsen or if the condition fails to improve as anticipated.     Kerby Nora, MD

## 2019-03-11 NOTE — Telephone Encounter (Signed)
Patient called and stated she was prescribed 2 new medication today from her virtual visit.   She stated that [facility] not receive [facility]E [facility] I spoke with a pharmacy and they also stated that they did not receive that script they only had the azelastine .  Can you resend this to the pharmacy ?  [facility]

## 2019-03-11 NOTE — Telephone Encounter (Signed)
Left message for Sherlon that [facility] were [facility] [place], but Dr. Diona Browner has now [facility] [place] for her.

## 2019-03-11 NOTE — Telephone Encounter (Signed)
Let pt know.. mistakenly [facility] [place]. Rxs send to [facility] now.

## 2019-03-11 NOTE — Assessment & Plan Note (Signed)
No acute respiratory distress.. no involvement of oral mucus membrane or breathing.  Trea with oral prednisone taper.  Allergy testing.  Continue orl antihistmaine of choice and add topical ophthalmic antihistamine (patanol)

## 2019-04-02 ENCOUNTER — Other Ambulatory Visit: Payer: Self-pay | Admitting: Family Medicine

## 2019-04-02 NOTE — Telephone Encounter (Signed)
Last office visit 03/11/2019 for allergic reaction.  Last refilled 03/11/2019 for 77ml with no refills.  No future appointments.

## 2019-11-09 ENCOUNTER — Encounter: Payer: Self-pay | Admitting: Family Medicine

## 2019-11-09 ENCOUNTER — Other Ambulatory Visit: Payer: Self-pay

## 2019-11-09 ENCOUNTER — Ambulatory Visit (INDEPENDENT_AMBULATORY_CARE_PROVIDER_SITE_OTHER): Payer: 59 | Admitting: Family Medicine

## 2019-11-09 VITALS — BP 110/80 | HR 84 | Temp 98.3°F | Ht 68.5 in | Wt 163.8 lb

## 2019-11-09 DIAGNOSIS — T7840XD Allergy, unspecified, subsequent encounter: Secondary | ICD-10-CM | POA: Diagnosis not present

## 2019-11-09 MED ORDER — PREDNISONE 20 MG PO TABS: ORAL_TABLET | ORAL | 0 refills | Status: DC

## 2019-11-09 NOTE — Patient Instructions (Signed)
Complete prednisone taper.  Go to ER if any oral swelling or shortness of breath.

## 2019-11-09 NOTE — Assessment & Plan Note (Signed)
Longer treatment course with oral steroids. ER precautions given. Stp mupirocin as not current infection.  Moisturize ears.

## 2019-11-09 NOTE — Progress Notes (Signed)
Chief Complaint  Patient presents with  . Allergic Reaction    ?[place]    History of Present Illness: HPI   49 year old female presents following  Reaction to hair dye.  She had similar  reaction in past in 02/2020. This time she tried a dye that was more natural, less irritant ingredients. 24 hours after dying hair: She had swelling, redness in scalp, blisters on tops of ears, and redness, itching  in face.  No SOB, no trouble swallowing, no oral swelling.   [place] given IM steroid, topical triamcinolone and mupirocin to put on ears.  Improving but still itchy and somewhat swollen in scalp. Mild redness in cheeks. Head and face  Still very itchy.     This visit occurred during the SARS-CoV-2 public health emergency.  Safety protocols were in place, including screening questions prior to the visit, additional usage of staff PPE, and extensive cleaning of exam room while observing appropriate contact time as indicated for disinfecting solutions.   COVID 19 screen:  No recent travel or known exposure to COVID19 The patient denies respiratory symptoms of COVID 19 at this time. The importance of social distancing was discussed today.     Review of Systems  Constitutional: Negative for chills and fever.  HENT: Negative for congestion and ear pain.   Eyes: Negative for pain and redness.  Respiratory: Negative for cough and shortness of breath.   Cardiovascular: Negative for chest pain, palpitations and leg swelling.  Gastrointestinal: Negative for abdominal pain, blood in stool, constipation, diarrhea, nausea and vomiting.  Genitourinary: Negative for dysuria.  Musculoskeletal: Negative for falls and myalgias.  Skin: Positive for itching and rash.  Neurological: Negative for dizziness.  Psychiatric/Behavioral: Negative for depression. The patient is not nervous/anxious.       Past Medical History:  Diagnosis  Date  . Anemia, unspecified   . Rheumatoid arthritis(714.0)   . Rupture, fallopian tube 1999    reports that she has never smoked. She has never used smokeless tobacco. She reports that she does not drink alcohol and does not use drugs.   Current Outpatient Medications:  .  azelastine (OPTIVAR) 0.05 % ophthalmic solution, INSTILL 1 DROP IN BOTH EYES TWICE DAILY OR AS DIRECTED, Disp: 6 mL, Rfl: 0 .  etanercept (ENBREL) 50 MG/ML injection, Inject 50 mg into the skin once a week. sundays, Disp: , Rfl:  .  methotrexate 2.5 MG tablet, Take 15 mg by mouth once a week. Sundays 6 tabs, Disp: , Rfl:  .  predniSONE (DELTASONE) 20 MG tablet, 3 tabs by mouth daily x 3 days, then 2 tabs by mouth daily x 2 days then 1 tab by mouth daily x 2 days, Disp: 15 tablet, Rfl: 0   Observations/Objective: There were no vitals taken for this visit.  Physical Exam Constitutional:      General: She is not in acute distress.    Appearance: Normal appearance. She is well-developed. She is not ill-appearing or toxic-appearing.  HENT:     Head: Normocephalic.     Right Ear: Hearing, tympanic membrane, ear canal and external ear normal. Tympanic membrane is not erythematous, retracted or bulging.     Left Ear: Hearing, tympanic membrane, ear canal and external ear normal. Tympanic membrane is not erythematous, retracted or bulging.     Nose: No mucosal edema or rhinorrhea.     Right Sinus: No maxillary sinus tenderness or  frontal sinus tenderness.     Left Sinus: No maxillary sinus tenderness or frontal sinus tenderness.     Mouth/Throat:     Pharynx: Uvula midline.  Eyes:     General: Lids are normal. Lids are everted, no foreign bodies appreciated.     Conjunctiva/sclera: Conjunctivae normal.     Pupils: Pupils are equal, round, and reactive to light.  Neck:     Thyroid: No thyroid mass or thyromegaly.     Vascular: No carotid bruit.     Trachea: Trachea normal.  Cardiovascular:     Rate and Rhythm: Normal  rate and regular rhythm.     Pulses: Normal pulses.     Heart sounds: Normal heart sounds, S1 normal and S2 normal. No murmur heard.  No friction rub. No gallop.   Pulmonary:     Effort: Pulmonary effort is normal. No tachypnea or respiratory distress.     Breath sounds: Normal breath sounds. No decreased breath sounds, wheezing, rhonchi or rales.  Abdominal:     General: Bowel sounds are normal.     Palpations: Abdomen is soft.     Tenderness: There is no abdominal tenderness.  Musculoskeletal:     Cervical back: Normal range of motion and neck supple.  Skin:    General: Skin is warm and dry.     Findings: No rash.     Comments: Erythema of cheeks, dry peeling skin on ears and in ear canals  Neurological:     Mental Status: She is alert.  Psychiatric:        Mood and Affect: Mood is not anxious or depressed.        Speech: Speech normal.        Behavior: Behavior normal. Behavior is cooperative.        Thought Content: Thought content normal.        Judgment: Judgment normal.      Assessment and Plan   Allergic reaction Longer treatment course with oral steroids. ER precautions given. Stp mupirocin as not current infection.  Moisturize ears.     Kerby Nora, MD

## 2019-12-16 ENCOUNTER — Ambulatory Visit (INDEPENDENT_AMBULATORY_CARE_PROVIDER_SITE_OTHER): Payer: 59 | Admitting: Family Medicine

## 2019-12-16 ENCOUNTER — Other Ambulatory Visit: Payer: Self-pay

## 2019-12-16 ENCOUNTER — Encounter: Payer: Self-pay | Admitting: Family Medicine

## 2019-12-16 VITALS — BP 104/68 | HR 61 | Temp 96.8°F | Ht 68.5 in | Wt 160.0 lb

## 2019-12-16 DIAGNOSIS — M5416 Radiculopathy, lumbar region: Secondary | ICD-10-CM | POA: Insufficient documentation

## 2019-12-16 MED ORDER — DICLOFENAC SODIUM 75 MG PO TBEC: 75.0000 mg | DELAYED_RELEASE_TABLET | Freq: Two times a day (BID) | ORAL | 0 refills | Status: DC

## 2019-12-16 NOTE — Assessment & Plan Note (Signed)
Mild symptoms. Treat with home PT, heat and NSAIDs.  No indication for X-ray at this time. She will call if not improving as expected.

## 2019-12-16 NOTE — Progress Notes (Signed)
Chief Complaint  Patient presents with  . Sciatica    having pain going down her left leg since may 2021, not taking anything, rest has help with pain     History of Present Illness: HPI  49 year old female patient with history of rheumatoid arthritis ( On enbrel and methotrexate) presents with new onset buttock pain radiating down left posterior upper  leg.   She reports she first noted pain in 08/2019 off and on, now pain is more frequent. Worse with sitting. No issues at night. No known falls Rest helps.  She has not tried medications.  No fever, incontinence.  No numbness (except occ thigh tingling), no weakness.   No past surgeries.   She is very active swimming in pool, walking.   This visit occurred during the SARS-CoV-2 public health emergency.  Safety protocols were in place, including screening questions prior to the visit, additional usage of staff PPE, and extensive cleaning of exam room while observing appropriate contact time as indicated for disinfecting solutions.   COVID 19 screen:  No recent travel or known exposure to COVID19 The patient denies respiratory symptoms of COVID 19 at this time. The importance of social distancing was discussed today.     Review of Systems  Constitutional: Negative for chills and fever.  HENT: Negative for congestion and ear pain.   Eyes: Negative for pain and redness.  Respiratory: Negative for cough and shortness of breath.   Cardiovascular: Negative for chest pain, palpitations and leg swelling.  Gastrointestinal: Negative for abdominal pain, blood in stool, constipation, diarrhea, nausea and vomiting.  Genitourinary: Negative for dysuria.  Musculoskeletal: Negative for falls and myalgias.  Skin: Negative for rash.  Neurological: Negative for dizziness.  Psychiatric/Behavioral: Negative for depression. The patient is not nervous/anxious.       Past Medical History:  Diagnosis Date  . Anemia, unspecified   .  Rheumatoid arthritis(714.0)   . Rupture, fallopian tube 1999    reports that she has never smoked. She has never used smokeless tobacco. She reports that she does not drink alcohol and does not use drugs.   Current Outpatient Medications:  .  etanercept (ENBREL) 50 MG/ML injection, Inject 50 mg into the skin once a week. sundays, Disp: , Rfl:  .  methotrexate 2.5 MG tablet, Take 15 mg by mouth once a week. Sundays 6 tabs, Disp: , Rfl:  .  mupirocin ointment (BACTROBAN) 2 %, SMARTSIG:Sparingly In Ear(s) 3 Times Daily PRN (Patient not taking: Reported on 12/16/2019), Disp: , Rfl:  .  predniSONE (DELTASONE) 20 MG tablet, 3 tabs by mouth daily x 3 days, then 2 tabs by mouth daily x 2 days then 1 tab by mouth daily x 2 days (Patient not taking: Reported on 12/16/2019), Disp: 15 tablet, Rfl: 0 .  triamcinolone cream (KENALOG) 0.1 %, SMARTSIG:Sparingly Topical Twice Daily (Patient not taking: Reported on 12/16/2019), Disp: , Rfl:    Observations/Objective: Blood pressure 104/68, pulse 61, temperature (!) 96.8 F (36 C), temperature source Temporal, height 5' 8.5" (1.74 m), weight 160 lb (72.6 kg), last menstrual period 12/01/2019, SpO2 96 %.  Physical Exam Constitutional:      General: She is not in acute distress.    Appearance: Normal appearance. She is well-developed. She is not ill-appearing or toxic-appearing.  HENT:     Head: Normocephalic.     Right Ear: Hearing, tympanic membrane, ear canal and external ear normal. Tympanic membrane is not erythematous, retracted or bulging.  Left Ear: Hearing, tympanic membrane, ear canal and external ear normal. Tympanic membrane is not erythematous, retracted or bulging.     Nose: No mucosal edema or rhinorrhea.     Right Sinus: No maxillary sinus tenderness or frontal sinus tenderness.     Left Sinus: No maxillary sinus tenderness or frontal sinus tenderness.     Mouth/Throat:     Pharynx: Uvula midline.  Eyes:     General: Lids are normal. Lids  are everted, no foreign bodies appreciated.     Conjunctiva/sclera: Conjunctivae normal.     Pupils: Pupils are equal, round, and reactive to light.  Neck:     Thyroid: No thyroid mass or thyromegaly.     Vascular: No carotid bruit.     Trachea: Trachea normal.  Cardiovascular:     Rate and Rhythm: Normal rate and regular rhythm.     Pulses: Normal pulses.     Heart sounds: Normal heart sounds, S1 normal and S2 normal. No murmur heard.  No friction rub. No gallop.   Pulmonary:     Effort: Pulmonary effort is normal. No tachypnea or respiratory distress.     Breath sounds: Normal breath sounds. No decreased breath sounds, wheezing, rhonchi or rales.  Abdominal:     General: Bowel sounds are normal.     Palpations: Abdomen is soft.     Tenderness: There is no abdominal tenderness.  Musculoskeletal:     Cervical back: Normal, normal range of motion and neck supple.     Thoracic back: Normal.     Lumbar back: Normal. No tenderness or bony tenderness. Normal range of motion. Negative right straight leg raise test and negative left straight leg raise test.     Comments: Points to buttock where tenderness is but no ttp.  Skin:    General: Skin is warm and dry.     Findings: No rash.  Neurological:     Mental Status: She is alert.  Psychiatric:        Mood and Affect: Mood is not anxious or depressed.        Speech: Speech normal.        Behavior: Behavior normal. Behavior is cooperative.        Thought Content: Thought content normal.        Judgment: Judgment normal.      Assessment and Plan Acute left lumbar radiculopathy Mild symptoms. Treat with home PT, heat and NSAIDs.  No indication for X-ray at this time. She will call if not improving as expected.       Kerby Nora, MD  \

## 2019-12-16 NOTE — Patient Instructions (Addendum)
Start heat on low back/buttock.  Start home PT stretching.  Use diclofenac twice daily for 2 weeks.  Call if pain not improving in 2 weeks for further eval.

## 2020-04-13 LAB — HM PAP SMEAR: HM Pap smear: NEGATIVE

## 2020-07-11 ENCOUNTER — Encounter: Payer: Self-pay | Admitting: Family Medicine

## 2020-07-11 ENCOUNTER — Ambulatory Visit (INDEPENDENT_AMBULATORY_CARE_PROVIDER_SITE_OTHER)
Admission: RE | Admit: 2020-07-11 | Discharge: 2020-07-11 | Disposition: A | Discharge status: Home / Self Care | Payer: 59 | Source: Ambulatory Visit | Attending: Family Medicine | Admitting: Family Medicine

## 2020-07-11 ENCOUNTER — Ambulatory Visit (INDEPENDENT_AMBULATORY_CARE_PROVIDER_SITE_OTHER): Payer: 59 | Admitting: Family Medicine

## 2020-07-11 ENCOUNTER — Other Ambulatory Visit: Payer: Self-pay

## 2020-07-11 VITALS — BP 110/80 | HR 68 | Temp 97.6°F | Ht 68.5 in | Wt 160.5 lb

## 2020-07-11 DIAGNOSIS — M25552 Pain in left hip: Secondary | ICD-10-CM

## 2020-07-11 NOTE — Patient Instructions (Addendum)
We will call with X-ray results after radiologist review them.  Will likely plan formal PT and/or prednisone taper to treat possible ischial bursitis if X-ray unrevealing.

## 2020-07-11 NOTE — Assessment & Plan Note (Signed)
At this paint.. pain is more over ischium tan in sciatic notch... ? Ischial bursitis.  Will eval lumbar spine and pelvis.  Will consider prednisone taper and/or PT.
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
## 2020-07-12 ENCOUNTER — Other Ambulatory Visit: Payer: Self-pay | Admitting: Family Medicine

## 2020-07-12 DIAGNOSIS — M25552 Pain in left hip: Secondary | ICD-10-CM

## 2020-07-12 MED ORDER — PREDNISONE 20 MG PO TABS: ORAL_TABLET | ORAL | 0 refills | Status: AC

## 2020-07-19 ENCOUNTER — Encounter: Payer: Self-pay | Admitting: Family Medicine

## 2021-07-10 IMAGING — DX DG PELVIS 1-2V
1 series · 1 of 1 positions shown · non-contrast
Comparison: None.

CLINICAL DATA: Ischial tuberosity pain

EXAM:
PELVIS - 1-2 VIEW

[pelvis ap]
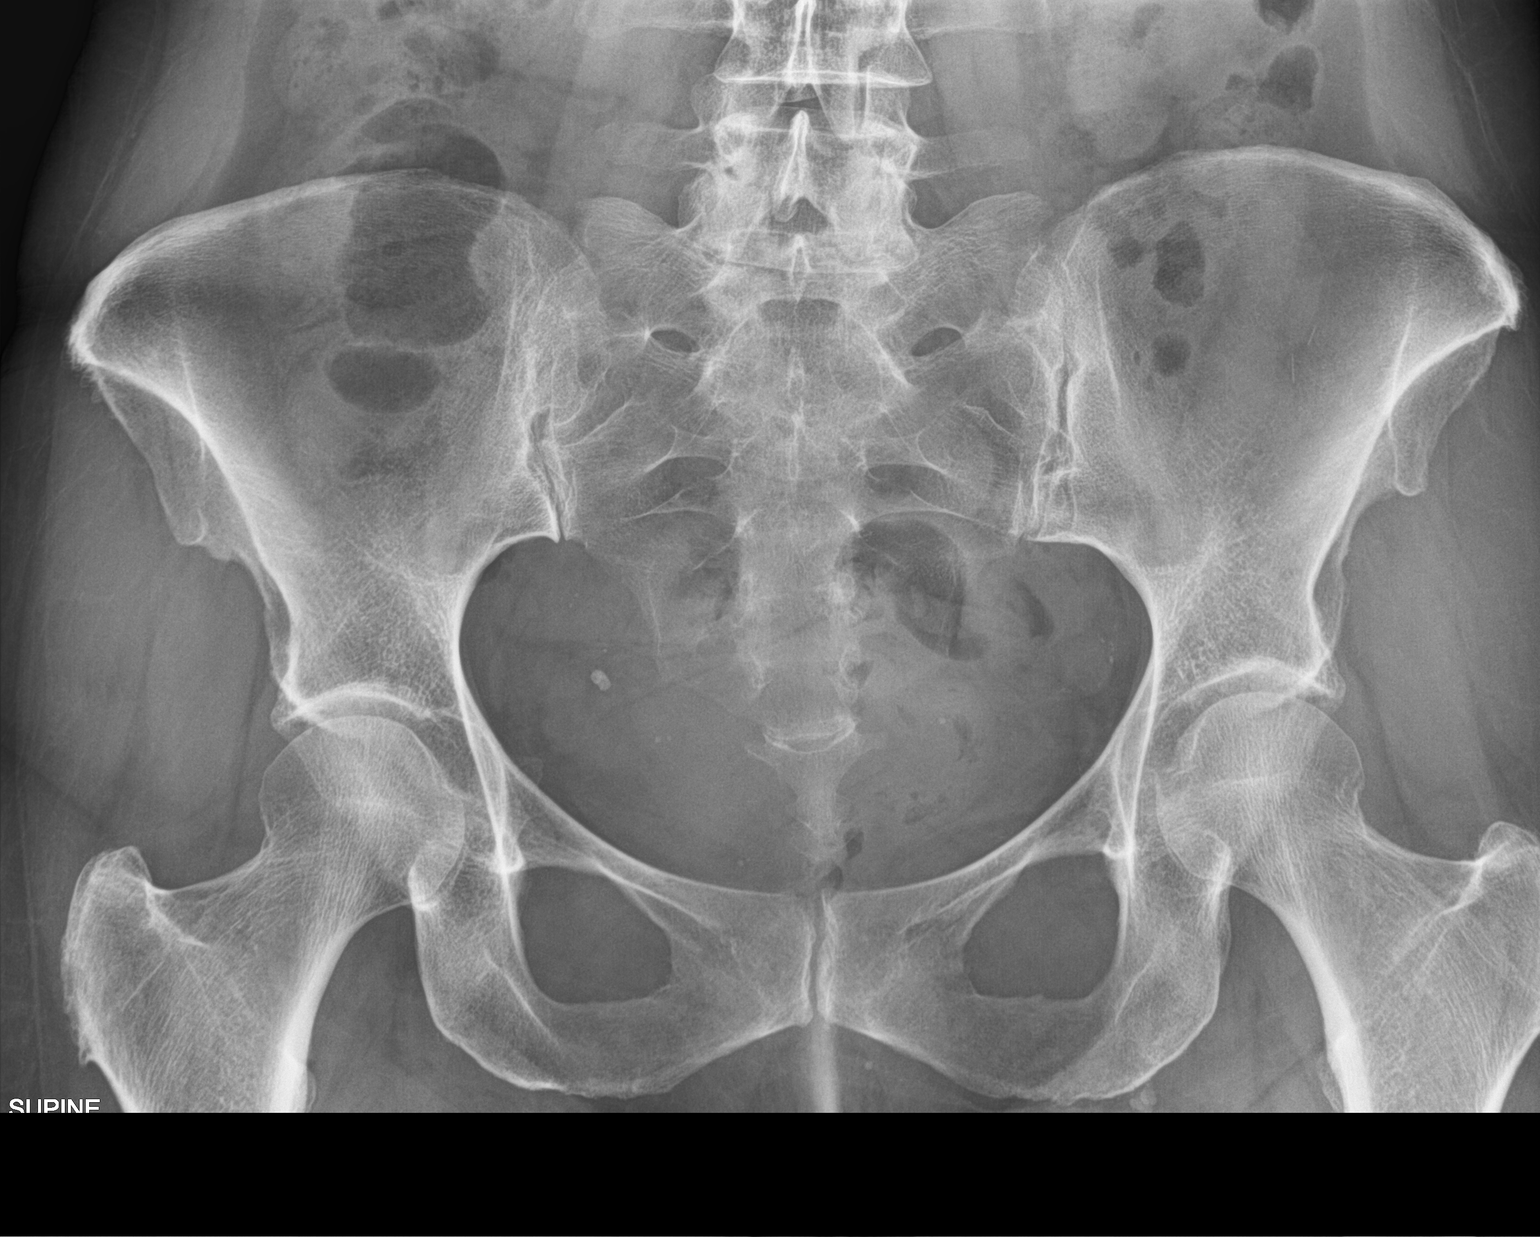

[1 of 1 positions shown; findings below may reference images not displayed]

FINDINGS: There is no evidence of pelvic fracture or diastasis. No pelvic bone
lesions are seen.
IMPRESSION: Negative.

## 2021-07-10 IMAGING — DX DG LUMBAR SPINE COMPLETE 4+V
5 series · 5 of 5 positions shown · non-contrast
Comparison: None.

CLINICAL DATA: Buttock pain

EXAM:
LUMBAR SPINE - COMPLETE 4+ VIEW

[l-spine ap]
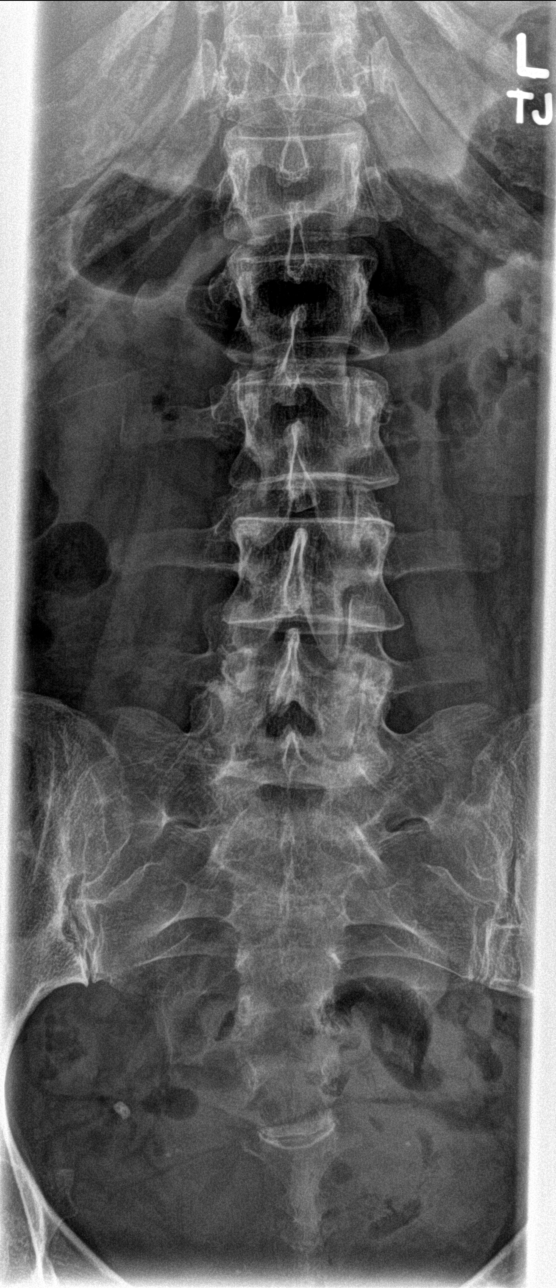

[l-spine obl (1 of 2)]
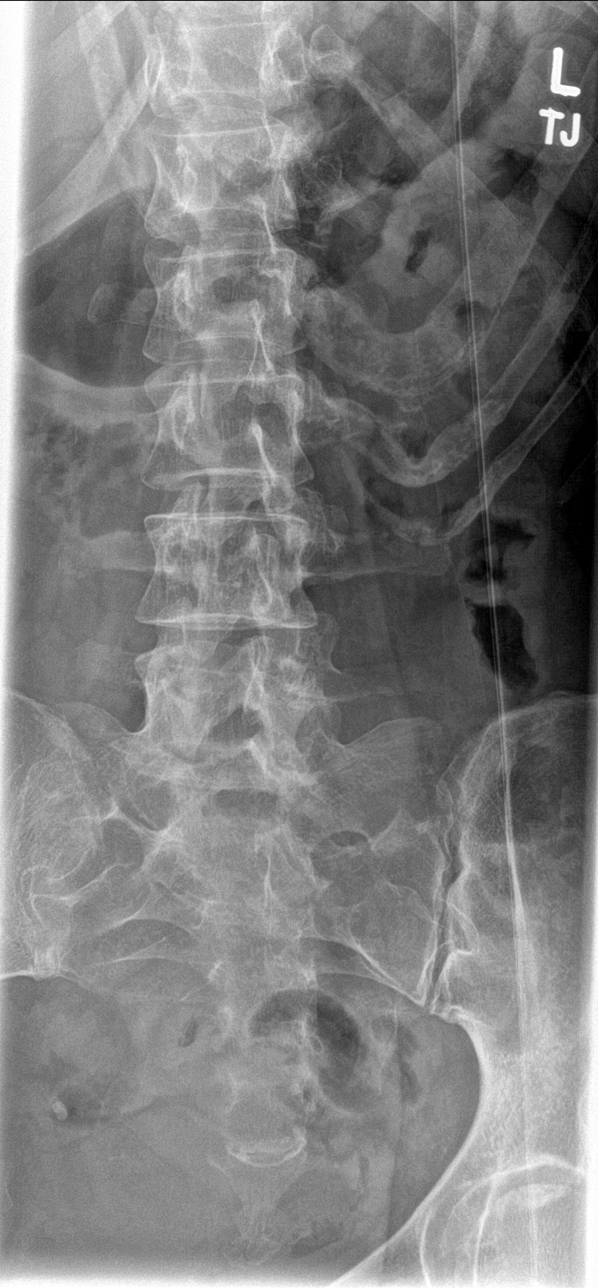

[l-spine obl (2 of 2)]
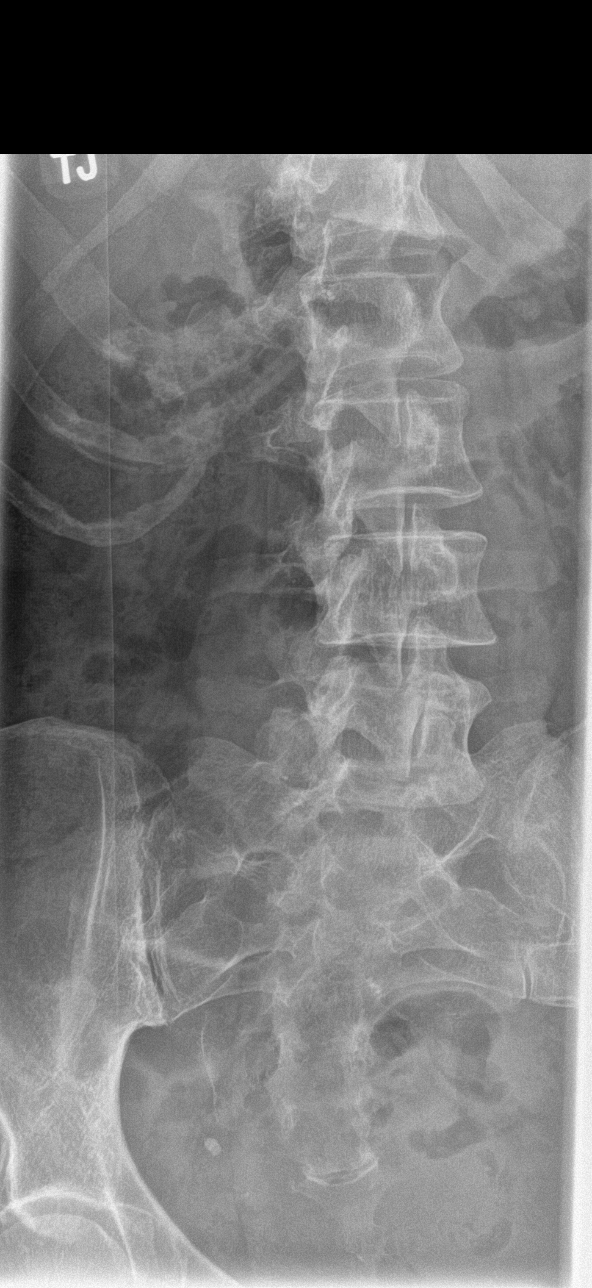

[l-spine lat]
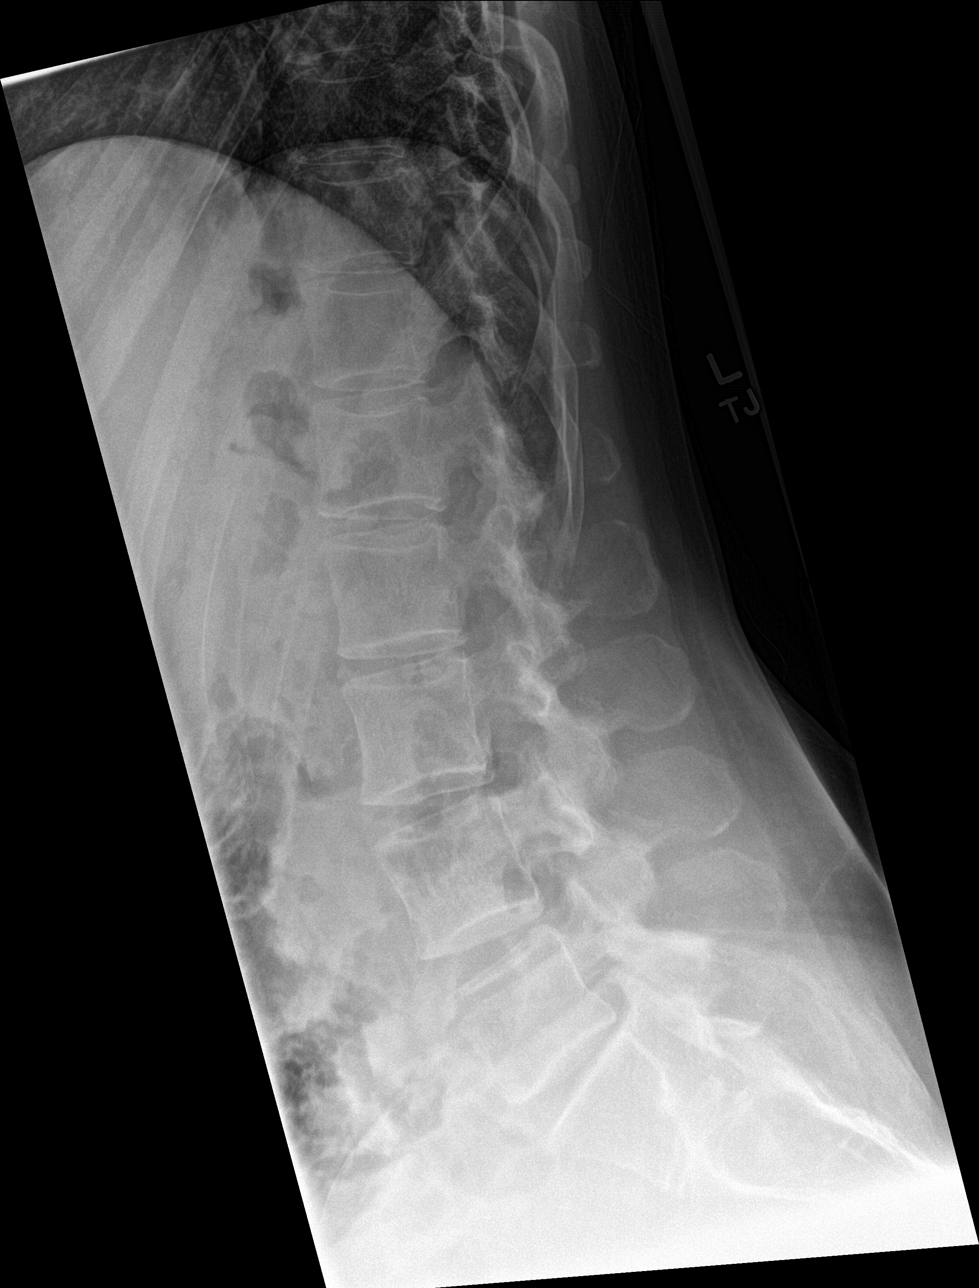

[l-spine l5/s1]
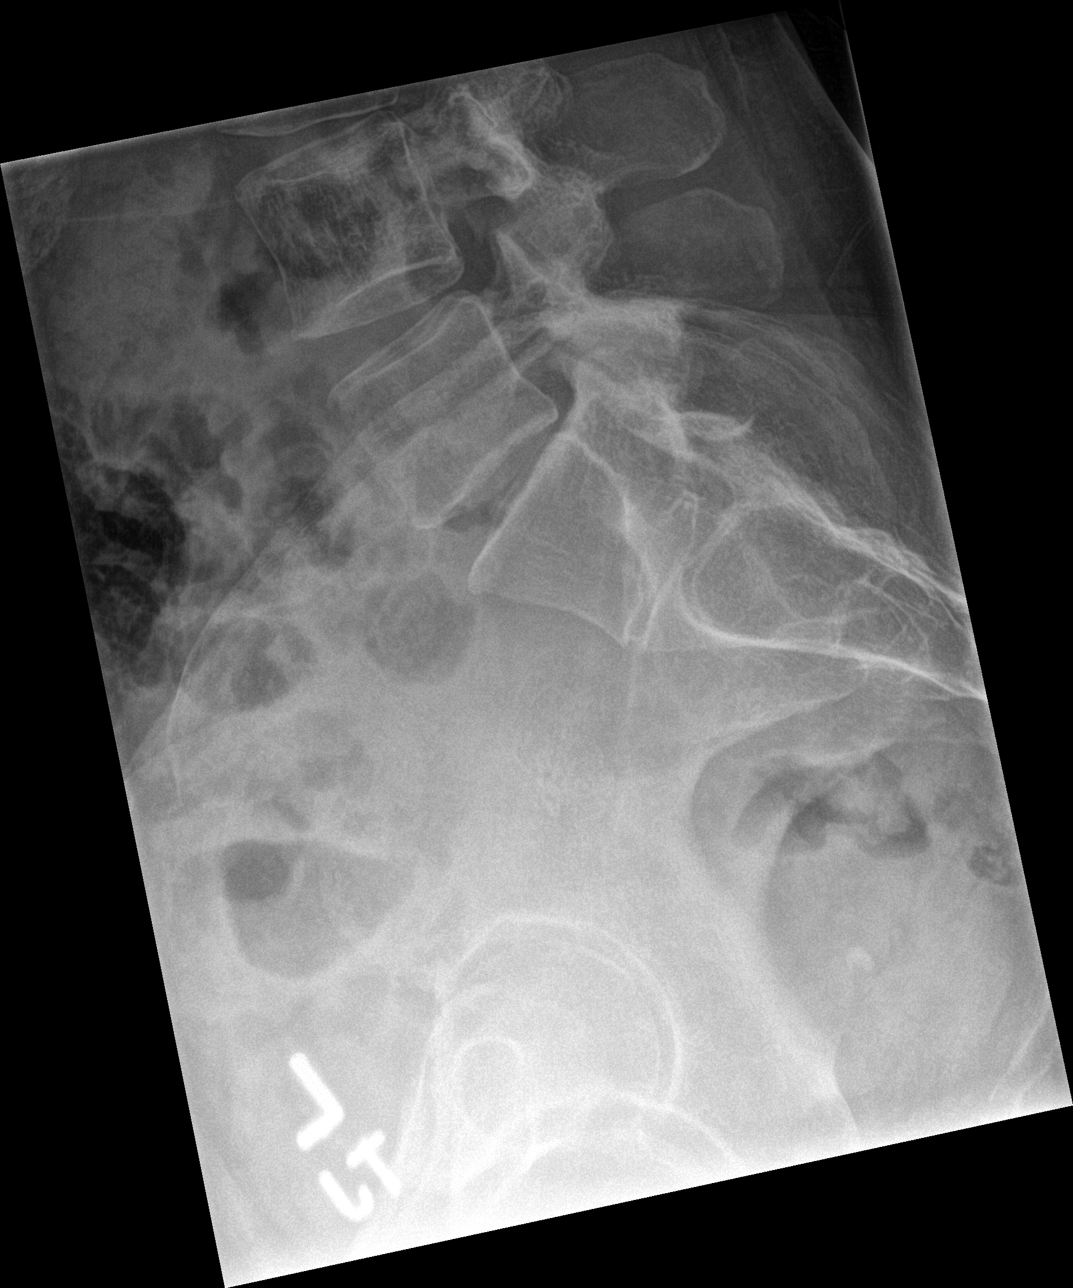

[5 of 5 positions shown; findings below may reference images not displayed]

FINDINGS: Probable transitional anatomy; transitional segment will be
sacralized L5. Lumbar alignment within normal limits. Vertebral body
heights are maintained. Mild disc space narrowing at L4-L5.
IMPRESSION: Suspected transitional anatomy.  Mild degenerative change.

## 2021-07-27 ENCOUNTER — Encounter: Payer: Self-pay | Admitting: Family Medicine

## 2021-07-27 ENCOUNTER — Telehealth: Payer: Self-pay | Admitting: *Deleted

## 2021-07-27 NOTE — Telephone Encounter (Signed)
Dr. Ermalene Searing received labs from Dr. Dierdre Forth.  Dr. Ermalene Searing would like patient to schedule CPE.  I have left a message as well as mailed patient a letter asking her to please call the office at [phone] to make that appointment with Dr. Ermalene Searing.  Labs sent for scanning.  FYI to Dr. Ermalene Searing.  ?

## 2022-12-13 ENCOUNTER — Telehealth: Payer: Self-pay | Admitting: Family Medicine

## 2022-12-13 NOTE — Telephone Encounter (Signed)
Called patient to schedule next available cpe. No answer, left VM
